# Patient Record
Sex: Male | Born: 1980 | Race: White | Hispanic: No | Marital: Married | Smoking: Never smoker
Health system: Southern US, Community
[De-identification: ages and names within clinical notes are randomized; demographics above are authoritative.]

## PROBLEM LIST (undated history)

## (undated) HISTORY — PX: APPENDECTOMY: SHX54

---

## 2005-10-30 HISTORY — PX: EYE SURGERY: SHX253

## 2011-04-27 ENCOUNTER — Other Ambulatory Visit: Payer: Self-pay | Admitting: Occupational Medicine

## 2011-04-27 ENCOUNTER — Ambulatory Visit: Payer: Self-pay

## 2011-04-27 DIAGNOSIS — M25562 Pain in left knee: Secondary | ICD-10-CM

## 2011-07-11 ENCOUNTER — Ambulatory Visit (INDEPENDENT_AMBULATORY_CARE_PROVIDER_SITE_OTHER): Payer: 59 | Admitting: Physician Assistant

## 2011-07-11 VITALS — BP 128/80 | HR 75 | Temp 98.1°F | Resp 18 | Ht 78.5 in | Wt 294.0 lb

## 2011-07-11 DIAGNOSIS — J069 Acute upper respiratory infection, unspecified: Secondary | ICD-10-CM

## 2011-07-11 MED ORDER — IPRATROPIUM BROMIDE 0.03 % NA SOLN: 2.0000 | Freq: Two times a day (BID) | NASAL | Status: AC

## 2011-07-11 MED ORDER — GUAIFENESIN ER 1200 MG PO TB12: 1.0000 | ORAL_TABLET | Freq: Two times a day (BID) | ORAL | Status: DC | PRN

## 2011-07-11 MED ORDER — BENZONATATE 100 MG PO CAPS: ORAL_CAPSULE | ORAL | Status: AC

## 2011-07-11 NOTE — Patient Instructions (Signed)
Drink 64 ounces of water each day, and get lots of rest.

## 2011-07-11 NOTE — Progress Notes (Signed)
  Subjective:    Patient ID: Marvin Pope, male    DOB: 28-Aug-1980, 31 y.o.   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  For the last several days he has had nasal congestion, sinus pressure, postnasal drainage and laryngitis. Some sore throat. Nonproductive cough. No fever or chills. No GI or GU symptoms. No myalgias or arthralgias not explained by his activity.  Review of Systems As above.    Objective:   Physical Exam Vital signs noted. Well-developed, well nourished WM who is awake, alert and oriented, in NAD. HEENT: [place]/AT, PERRL, EOMI.  Sclera and conjunctiva are clear.  EAC are patent, TMs are normal in appearance. Nasal mucosa is pink and moist. OP is clear. Neck: supple, non-tender, no lymphadenopathey, thyromegaly. Heart: RRR, no murmur Lungs: CTA Abdomen: normo-active bowel sounds, supple, non-tender, no mass or organomegaly. Extremities: no cyanosis, clubbing or edema. Skin: warm and dry without rash.        Assessment & Plan:  Viral URI.  Atrovent 0.03% nasal spray, Tessalon Perles, Mucinex maximum strength. Rest, fluids. Anticipatory guidance. Reevaluate if symptoms worsen or persist.

## 2014-05-18 ENCOUNTER — Ambulatory Visit (INDEPENDENT_AMBULATORY_CARE_PROVIDER_SITE_OTHER): Payer: 59 | Admitting: Family Medicine

## 2014-05-18 ENCOUNTER — Ambulatory Visit (INDEPENDENT_AMBULATORY_CARE_PROVIDER_SITE_OTHER): Payer: 59

## 2014-05-18 VITALS — BP 126/80 | HR 60 | Temp 98.1°F | Resp 18 | Ht 78.5 in | Wt 313.2 lb

## 2014-05-18 DIAGNOSIS — M25531 Pain in right wrist: Secondary | ICD-10-CM
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
## 2016-11-07 DIAGNOSIS — M9902 Segmental and somatic dysfunction of thoracic region: Secondary | ICD-10-CM | POA: Diagnosis not present

## 2016-11-07 DIAGNOSIS — M9905 Segmental and somatic dysfunction of pelvic region: Secondary | ICD-10-CM | POA: Diagnosis not present

## 2016-11-07 DIAGNOSIS — M9903 Segmental and somatic dysfunction of lumbar region: Secondary | ICD-10-CM | POA: Diagnosis not present

## 2016-11-10 DIAGNOSIS — M9905 Segmental and somatic dysfunction of pelvic region: Secondary | ICD-10-CM | POA: Diagnosis not present

## 2016-11-10 DIAGNOSIS — M9903 Segmental and somatic dysfunction of lumbar region: Secondary | ICD-10-CM | POA: Diagnosis not present

## 2016-11-10 DIAGNOSIS — M9902 Segmental and somatic dysfunction of thoracic region: Secondary | ICD-10-CM | POA: Diagnosis not present

## 2016-11-14 ENCOUNTER — Ambulatory Visit (INDEPENDENT_AMBULATORY_CARE_PROVIDER_SITE_OTHER): Payer: 59 | Admitting: Emergency Medicine

## 2016-11-14 ENCOUNTER — Ambulatory Visit (INDEPENDENT_AMBULATORY_CARE_PROVIDER_SITE_OTHER): Payer: 59

## 2016-11-14 ENCOUNTER — Encounter: Payer: Self-pay | Admitting: Emergency Medicine

## 2016-11-14 VITALS — BP 130/80 | HR 77 | Temp 98.2°F | Ht 78.0 in | Wt 334.4 lb

## 2016-11-14 DIAGNOSIS — M545 Low back pain, unspecified: Secondary | ICD-10-CM

## 2016-11-14 DIAGNOSIS — M47816 Spondylosis without myelopathy or radiculopathy, lumbar region: Secondary | ICD-10-CM | POA: Diagnosis not present

## 2016-11-14 DIAGNOSIS — Z0000 Encounter for general adult medical examination without abnormal findings: Secondary | ICD-10-CM

## 2016-11-14 NOTE — Patient Instructions (Addendum)
   IF you received an x-ray today, you will receive an invoice from [facility]. Please contact [facility] at [phone] with questions or concerns regarding your invoice.   IF you received labwork today, you will receive an invoice from [facility]. Please contact [facility] at [phone] with questions or concerns regarding your invoice.   Our billing staff will not be able to assist you with questions regarding bills from these companies.  You will be contacted with the lab results as soon as they are available. The fastest way to get your results is to activate your My Chart account. Instructions are located on the last page of this paperwork. If you have not heard from us regarding the results in 2 weeks, please contact this office.      Health Maintenance, Male A healthy lifestyle and preventive care is important for your health and wellness. Ask your health care provider about what schedule of regular examinations is right for you. What should I know about weight and diet? Eat a Healthy Diet  Eat plenty of vegetables, fruits, whole grains, low-fat dairy products, and lean protein.  Do not eat a lot of foods high in solid fats, added sugars, or salt.  Maintain a Healthy Weight Regular exercise can help you achieve or maintain a healthy weight. You should:  Do at least 150 minutes of exercise each week. The exercise should increase your heart rate and make you sweat (moderate-intensity exercise).  Do strength-training exercises at least twice a week.  Watch Your Levels of Cholesterol and Blood Lipids  Have your blood tested for lipids and cholesterol every 5 years starting at 35 years of age. If you are at high risk for heart disease, you should start having your blood tested when you are 35 years old. You may need to have your cholesterol levels checked more often if: ? Your lipid or cholesterol levels are high. ? You are older than 35 years of age. ? You  are at high risk for heart disease.  What should I know about cancer screening? Many types of cancers can be detected early and may often be prevented. Lung Cancer  You should be screened every year for lung cancer if: ? You are a current smoker who has smoked for at least 30 years. ? You are a former smoker who has quit within the past 15 years.  Talk to your health care provider about your screening options, when you should start screening, and how often you should be screened.  Colorectal Cancer  Routine colorectal cancer screening usually begins at 35 years of age and should be repeated every 5-10 years until you are 35 years old. You may need to be screened more often if early forms of precancerous polyps or small growths are found. Your health care provider may recommend screening at an earlier age if you have risk factors for colon cancer.  Your health care provider may recommend using home test kits to check for hidden blood in the stool.  A small camera at the end of a tube can be used to examine your colon (sigmoidoscopy or colonoscopy). This checks for the earliest forms of colorectal cancer.  Prostate and Testicular Cancer  Depending on your age and overall health, your health care provider may do certain tests to screen for prostate and testicular cancer.  Talk to your health care provider about any symptoms or concerns you have about testicular or prostate cancer.  Skin Cancer  Check your skin   from head to toe regularly.  Tell your health care provider about any new moles or changes in moles, especially if: ? There is a change in a mole's size, shape, or color. ? You have a mole that is larger than a pencil eraser.  Always use sunscreen. Apply sunscreen liberally and repeat throughout the day.  Protect yourself by wearing long sleeves, pants, a wide-brimmed hat, and sunglasses when outside.  What should I know about heart disease, diabetes, and high blood  pressure?  If you are 18-39 years of age, have your blood pressure checked every 3-5 years. If you are 40 years of age or older, have your blood pressure checked every year. You should have your blood pressure measured twice-once when you are at a hospital or clinic, and once when you are not at a hospital or clinic. Record the average of the two measurements. To check your blood pressure when you are not at a hospital or clinic, you can use: ? An automated blood pressure machine at a pharmacy. ? A home blood pressure monitor.  Talk to your health care provider about your target blood pressure.  If you are between 45-79 years old, ask your health care provider if you should take aspirin to prevent heart disease.  Have regular diabetes screenings by checking your fasting blood sugar level. ? If you are at a normal weight and have a low risk for diabetes, have this test once every three years after the age of 45. ? If you are overweight and have a high risk for diabetes, consider being tested at a younger age or more often.  A one-time screening for abdominal aortic aneurysm (AAA) by ultrasound is recommended for men aged 65-75 years who are current or former smokers. What should I know about preventing infection? Hepatitis B If you have a higher risk for hepatitis B, you should be screened for this virus. Talk with your health care provider to find out if you are at risk for hepatitis B infection. Hepatitis C Blood testing is recommended for:  Everyone born from 1945 through 1965.  Anyone with known risk factors for hepatitis C.  Sexually Transmitted Diseases (STDs)  You should be screened each year for STDs including gonorrhea and chlamydia if: ? You are sexually active and are younger than 35 years of age. ? You are older than 35 years of age and your health care provider tells you that you are at risk for this type of infection. ? Your sexual activity has changed since you were last  screened and you are at an increased risk for chlamydia or gonorrhea. Ask your health care provider if you are at risk.  Talk with your health care provider about whether you are at high risk of being infected with HIV. Your health care provider may recommend a prescription medicine to help prevent HIV infection.  What else can I do?  Schedule regular health, dental, and eye exams.  Stay current with your vaccines (immunizations).  Do not use any tobacco products, such as cigarettes, chewing tobacco, and e-cigarettes. If you need help quitting, ask your health care provider.  Limit alcohol intake to no more than 2 drinks per day. One drink equals 12 ounces of beer, 5 ounces of wine, or 1 ounces of hard liquor.  Do not use street drugs.  Do not share needles.  Ask your health care provider for help if you need support or information about quitting drugs.  Tell your health care   provider if you often feel depressed.  Tell your health care provider if you have ever been abused or do not feel safe at home. This information is not intended to replace advice given to you by your health care provider. Make sure you discuss any questions you have with your health care provider. Document Released: 10/15/2007 Document Revised: 12/16/2015 Document Reviewed: 01/20/2015 Elsevier Interactive Patient Education  2018 [employer]  American Heart Association (AHA) Exercise Recommendation  Being physically active is important to prevent heart disease and stroke, the nation's No. 1and No. 5killers. To improve overall cardiovascular health, we suggest at least 150 minutes per week of moderate exercise or 75 minutes per week of vigorous exercise (or a combination of moderate and vigorous activity). Thirty minutes a day, five times a week is an easy goal to remember. You will also experience benefits even if you divide your time into two or three segments of 10 to 15 minutes per day.  For people who would  benefit from lowering their blood pressure or cholesterol, we recommend 40 minutes of aerobic exercise of moderate to vigorous intensity three to four times a week to lower the risk for heart attack and stroke.  Physical activity is anything that makes you move your body and burn calories.  This includes things like climbing stairs or playing sports. Aerobic exercises benefit your heart, and include walking, jogging, swimming or biking. Strength and stretching exercises are best for overall stamina and flexibility.  The simplest, positive change you can make to effectively improve your heart health is to start walking. It's enjoyable, free, easy, social and great exercise. A walking program is flexible and boasts high success rates because people can stick with it. It's easy for walking to become a regular and satisfying part of life.   For Overall Cardiovascular Health:  At least 30 minutes of moderate-intensity aerobic activity at least 5 days per week for a total of 150  OR   At least 25 minutes of vigorous aerobic activity at least 3 days per week for a total of 75 minutes; or a combination of moderate- and vigorous-intensity aerobic activity  AND   Moderate- to high-intensity muscle-strengthening activity at least 2 days per week for additional health benefits.  For Lowering Blood Pressure and Cholesterol  An average 40 minutes of moderate- to vigorous-intensity aerobic activity 3 or 4 times per week  What if I can't make it to the time goal? Something is always better than nothing! And everyone has to start somewhere. Even if you've been sedentary for years, today is the day you can begin to make healthy changes in your life. If you don't think you'll make it for 30 or 40 minutes, set a reachable goal for today. You can work up toward your overall goal by increasing your time as you get stronger. Don't let all-or-nothing thinking rob you of doing what you can every day.   Source:http://www.heart.org    

## 2016-11-14 NOTE — Progress Notes (Signed)
Marvin InchesJames Pope 36 y.o.   Chief Complaint  Patient presents with  . Annual Exam    HISTORY OF PRESENT ILLNESS: This is a 36 y.o. male here for annual exam. HPI   Prior to Admission medications   Not on File    Allergies  Allergen Reactions  . Amoxicillin Diarrhea    There are no active problems to display for this patient.   No past medical history on file.  Past Surgical History:  Procedure Laterality Date  . APPENDECTOMY    . EYE SURGERY Bilateral 10/2005    Social History   Social History  . Marital status: Married    Spouse name: N/A  . Number of children: N/A  . Years of education: N/A   Occupational History  . Not on file.   Social History Main Topics  . Smoking status: Never Smoker  . Smokeless tobacco: Never Used  . Alcohol use 1.2 oz/week    2 Cans of beer per week  . Drug use: Unknown  . Sexual activity: Yes    Partners: Female   Other Topics Concern  . Not on file   Social History Narrative  . No narrative on file    Family History  Problem Relation Age of Onset  . Hypertension Father   . Heart disease Maternal Grandfather   . Cancer Paternal Grandfather        lung  . Diabetes Paternal Grandfather   . Heart disease Paternal Grandfather   . Stroke Paternal Grandfather   . Hypertension Paternal Grandfather   . Hyperlipidemia Paternal Grandfather      Review of Systems  Constitutional: Negative.  Negative for chills and fever.  HENT: Negative.   Eyes: Negative.  Negative for blurred vision and double vision.  Respiratory: Negative.  Negative for cough, shortness of breath and wheezing.   Cardiovascular: Negative.  Negative for chest pain and palpitations.  Gastrointestinal: Negative.  Negative for abdominal pain, diarrhea, nausea and vomiting.  Genitourinary: Negative.  Negative for hematuria.  Musculoskeletal: Positive for back pain.  Skin: Negative.  Negative for rash.  Neurological: Negative.  Negative for dizziness and  headaches.  Endo/Heme/Allergies: Negative.   All other systems reviewed and are negative.  Vitals:   11/14/16 1129  BP: 130/80  Pulse: 77  Temp: 98.2 F (36.8 C)     Physical Exam  Constitutional: He is oriented to person, place, and time. He appears well-developed and well-nourished.  HENT:  Head: Normocephalic and atraumatic.  Nose: Nose normal.  Mouth/Throat: Oropharynx is clear and moist. No oropharyngeal exudate.  Eyes: Pupils are equal, round, and reactive to light. Conjunctivae and EOM are normal.  Neck: Normal range of motion. Neck supple. No JVD present. No thyromegaly present.  Cardiovascular: Normal rate, regular rhythm, normal heart sounds and intact distal pulses.   Pulmonary/Chest: Effort normal and breath sounds normal.  Abdominal: Soft. Bowel sounds are normal. He exhibits no distension. There is no tenderness.  Musculoskeletal: Normal range of motion.  Lymphadenopathy:    He has no cervical adenopathy.  Neurological: He is alert and oriented to person, place, and time. No sensory deficit. He exhibits normal muscle tone.  Skin: Skin is warm and dry. Capillary refill takes less than 2 seconds.  Psychiatric: He has a normal mood and affect. His behavior is normal.   Dg Lumbar Spine 2-3 Views  Result Date: 11/14/2016 CLINICAL DATA:  Lumbago EXAM: LUMBAR SPINE - 2-3 VIEW COMPARISON:  None. FINDINGS: Frontal, lateral, and spot lumbosacral  lateral images were obtained. There are 5 non-rib-bearing lumbar type vertebral bodies. There is no fracture or spondylolisthesis. There is moderate disc space narrowing at L4-5. Other disc spaces appear unremarkable. No erosive change. 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 No fracture or spondylolisthesis. Electronically Signed   By: Bretta Bang III M.D.   On: 11/14/2016 12:06     ASSESSMENT & PLAN: [name] was seen today for annual exam.  Diagnoses and all orders for this visit:  Routine general medical  examination at a health care facility -     CBC with Differential -     Comprehensive metabolic panel -     Hemoglobin A1c -     Lipid panel -     TSH -     HIV antibody  Lumbar pain -     DG Lumbar Spine 2-3 Views; Future    Patient Instructions       IF you received an x-ray today, you will receive an invoice from [facility]. Please contact [facility] at [phone] with questions or concerns regarding your invoice.   IF you received labwork today, you will receive an invoice from [facility]. Please contact [facility] at [phone] with questions or concerns regarding your invoice.   Our billing staff will not be able to assist you with questions regarding bills from these companies.  You will be contacted with the lab results as soon as they are available. The fastest way to get your results is to activate your My Chart account. Instructions are located on the last page of this paperwork. If you have not heard from [place] regarding the results in 2 weeks, please contact this office.        Health Maintenance, Male A healthy lifestyle and preventive care is important for your health and wellness. Ask your health care provider about what schedule of regular examinations is right for you. What should I know about weight and diet? Eat a Healthy Diet  Eat plenty of vegetables, fruits, whole grains, low-fat dairy products, and lean protein.  Do not eat a lot of foods high in solid fats, added sugars, or salt.  Maintain a Healthy Weight Regular exercise can help you achieve or maintain a healthy weight. You should:  Do at least 150 minutes of exercise each week. The exercise should increase your heart rate and make you sweat (moderate-intensity exercise).  Do strength-training exercises at least twice a week.  Watch Your Levels of Cholesterol and Blood Lipids  Have your blood tested for lipids and cholesterol every 5 years starting at 35 years of age. If  you are at high risk for heart disease, you should start having your blood tested when you are 35 years old. You may need to have your cholesterol levels checked more often if: ? Your lipid or cholesterol levels are high. ? You are older than 35 years of age. ? You are at high risk for heart disease.  What should I know about cancer screening? Many types of cancers can be detected early and may often be prevented. Lung Cancer  You should be screened every year for lung cancer if: ? You are a current smoker who has smoked for at least 30 years. ? You are a former smoker who has quit within the past 15 years.  Talk to your health care provider about your screening options, when you should start screening, and how often you should be screened.  Colorectal Cancer  Routine colorectal cancer screening usually  begins at 35 years of age and should be repeated every 5-10 years until you are 35 years old. You may need to be screened more often if early forms of precancerous polyps or small growths are found. Your health care provider may recommend screening at an earlier age if you have risk factors for colon cancer.  Your health care provider may recommend using home test kits to check for hidden blood in the stool.  A small camera at the end of a tube can be used to examine your colon (sigmoidoscopy or colonoscopy). This checks for the earliest forms of colorectal cancer.  Prostate and Testicular Cancer  Depending on your age and overall health, your health care provider may do certain tests to screen for prostate and testicular cancer.  Talk to your health care provider about any symptoms or concerns you have about testicular or prostate cancer.  Skin Cancer  Check your skin from head to toe regularly.  Tell your health care provider about any new moles or changes in moles, especially if: ? There is a change in a mole's size, shape, or color. ? You have a mole that is larger than a pencil  eraser.  Always use sunscreen. Apply sunscreen liberally and repeat throughout the day.  Protect yourself by wearing long sleeves, pants, a wide-brimmed hat, and sunglasses when outside.  What should I know about heart disease, diabetes, and high blood pressure?  If you are 90-6 years of age, have your blood pressure checked every 3-5 years. If you are 72 years of age or older, have your blood pressure checked every year. You should have your blood pressure measured twice-once when you are at a hospital or clinic, and once when you are not at a hospital or clinic. Record the average of the two measurements. To check your blood pressure when you are not at a hospital or clinic, you can use: ? An automated blood pressure machine at a pharmacy. ? A home blood pressure monitor.  Talk to your health care provider about your target blood pressure.  If you are between 47-13 years old, ask your health care provider if you should take aspirin to prevent heart disease.  Have regular diabetes screenings by checking your fasting blood sugar level. ? If you are at a normal weight and have a low risk for diabetes, have this test once every three years after the age of 18. ? If you are overweight and have a high risk for diabetes, consider being tested at a younger age or more often.  A one-time screening for abdominal aortic aneurysm (AAA) by ultrasound is recommended for men aged 65-75 years who are current or former smokers. What should I know about preventing infection? Hepatitis B If you have a higher risk for hepatitis B, you should be screened for this virus. Talk with your health care provider to find out if you are at risk for hepatitis B infection. Hepatitis C Blood testing is recommended for:  Everyone born from [birth year] through 1964.  Anyone with known risk factors for hepatitis C.  Sexually Transmitted Diseases (STDs)  You should be screened each year for STDs including gonorrhea and  chlamydia if: ? You are sexually active and are younger than 35 years of age. ? You are older than 35 years of age and your health care provider tells you that you are at risk for this type of infection. ? Your sexual activity has changed since you were last screened and you  are at an increased risk for chlamydia or gonorrhea. Ask your health care provider if you are at risk.  Talk with your health care provider about whether you are at high risk of being infected with HIV. Your health care provider may recommend a prescription medicine to help prevent HIV infection.  What else can I do?  Schedule regular health, dental, and eye exams.  Stay current with your vaccines (immunizations).  Do not use any tobacco products, such as cigarettes, chewing tobacco, and e-cigarettes. If you need help quitting, ask your health care provider.  Limit alcohol intake to no more than 2 drinks per day. One drink equals 12 ounces of beer, 5 ounces of wine, or 1 ounces of hard liquor.  Do not use street drugs.  Do not share needles.  Ask your health care provider for help if you need support or information about quitting drugs.  Tell your health care provider if you often feel depressed.  Tell your health care provider if you have ever been abused or do not feel safe at home. This information is not intended to replace advice given to you by your health care provider. Make sure you discuss any questions you have with your health care provider. Document Released: 10/15/2007 Document Revised: 12/16/2015 Document Reviewed: 01/20/2015 Elsevier Interactive Patient Education  2018 [employer].  American Heart Association (AHA) Exercise Recommendation  Being physically active is important to prevent heart disease and stroke, the nation's No. 1and No. 5killers. To improve overall cardiovascular health, we suggest at least 150 minutes per week of moderate exercise or 75 minutes per week of vigorous exercise (or  a combination of moderate and vigorous activity). Thirty minutes a day, five times a week is an easy goal to remember. You will also experience benefits even if you divide your time into two or three segments of 10 to 15 minutes per day.  For people who would benefit from lowering their blood pressure or cholesterol, we recommend 40 minutes of aerobic exercise of moderate to vigorous intensity three to four times a week to lower the risk for heart attack and stroke.  Physical activity is anything that makes you move your body and burn calories.  This includes things like climbing stairs or playing sports. Aerobic exercises benefit your heart, and include walking, jogging, swimming or biking. Strength and stretching exercises are best for overall stamina and flexibility.  The simplest, positive change you can make to effectively improve your heart health is to start walking. It's enjoyable, free, easy, social and great exercise. A walking program is flexible and boasts high success rates because people can stick with it. It's easy for walking to become a regular and satisfying part of life.   For Overall Cardiovascular Health:  At least 30 minutes of moderate-intensity aerobic activity at least 5 days per week for a total of 150  OR   At least 25 minutes of vigorous aerobic activity at least 3 days per week for a total of 75 minutes; or a combination of moderate- and vigorous-intensity aerobic activity  AND   Moderate- to high-intensity muscle-strengthening activity at least 2 days per week for additional health benefits.  For Lowering Blood Pressure and Cholesterol  An average 40 minutes of moderate- to vigorous-intensity aerobic activity 3 or 4 times per week  What if I can't make it to the time goal? Something is always better than nothing! And everyone has to start somewhere. Even if you've been sedentary for years, today  is the day you can begin to make healthy changes in your life.  If you don't think you'll make it for 30 or 40 minutes, set a reachable goal for today. You can work up toward your overall goal by increasing your time as you get stronger. Don't let all-or-nothing thinking rob you of doing what you can every day.  Source:[email or web address] MD [email or web address]

## 2016-11-15 DIAGNOSIS — M9903 Segmental and somatic dysfunction of lumbar region: Secondary | ICD-10-CM | POA: Diagnosis not present

## 2016-11-15 DIAGNOSIS — M9905 Segmental and somatic dysfunction of pelvic region: Secondary | ICD-10-CM | POA: Diagnosis not present

## 2016-11-15 DIAGNOSIS — M9902 Segmental and somatic dysfunction of thoracic region: Secondary | ICD-10-CM | POA: Diagnosis not present

## 2016-11-15 LAB — CBC WITH DIFFERENTIAL/PLATELET
BASOS ABS: 0 10*3/uL (ref 0.0–0.2)
Basos: 0 %
EOS (ABSOLUTE): 0.3 10*3/uL (ref 0.0–0.4)
Eos: 3 %
Hematocrit: 44.2 % (ref 37.5–51.0)
Hemoglobin: 15.7 g/dL (ref 13.0–17.7)
Immature Grans (Abs): 0.1 10*3/uL (ref 0.0–0.1)
Immature Granulocytes: 1 %
LYMPHS ABS: 2.9 10*3/uL (ref 0.7–3.1)
Lymphs: 31 %
MCH: 32.4 pg (ref 26.6–33.0)
MCHC: 35.5 g/dL (ref 31.5–35.7)
MCV: 91 fL (ref 79–97)
MONOS ABS: 0.5 10*3/uL (ref 0.1–0.9)
Monocytes: 6 %
NEUTROS ABS: 5.4 10*3/uL (ref 1.4–7.0)
Neutrophils: 59 %
PLATELETS: 299 10*3/uL (ref 150–379)
RBC: 4.85 x10E6/uL (ref 4.14–5.80)
RDW: 13.7 % (ref 12.3–15.4)
WBC: 9.2 10*3/uL (ref 3.4–10.8)

## 2016-11-15 LAB — COMPREHENSIVE METABOLIC PANEL
ALBUMIN: 4.6 g/dL (ref 3.5–5.5)
ALK PHOS: 72 IU/L (ref 39–117)
ALT: 50 IU/L — ABNORMAL HIGH (ref 0–44)
AST: 26 IU/L (ref 0–40)
Albumin/Globulin Ratio: 1.7 (ref 1.2–2.2)
BUN / CREAT RATIO: 15 (ref 9–20)
BUN: 16 mg/dL (ref 6–20)
Bilirubin Total: 0.5 mg/dL (ref 0.0–1.2)
CO2: 19 mmol/L — ABNORMAL LOW (ref 20–29)
Calcium: 10.3 mg/dL — ABNORMAL HIGH (ref 8.7–10.2)
Chloride: 103 mmol/L (ref 96–106)
Creatinine, Ser: 1.06 mg/dL (ref 0.76–1.27)
GFR calc Af Amer: 105 mL/min/{1.73_m2} (ref >59)
GFR calc non Af Amer: 91 mL/min/{1.73_m2} (ref >59)
GLUCOSE: 88 mg/dL (ref 65–99)
Globulin, Total: 2.7 g/dL (ref 1.5–4.5)
Potassium: 4.9 mmol/L (ref 3.5–5.2)
Sodium: 143 mmol/L (ref 134–144)
Total Protein: 7.3 g/dL (ref 6.0–8.5)

## 2016-11-15 LAB — TSH: TSH: 2.27 u[IU]/mL (ref 0.450–4.500)

## 2016-11-15 LAB — LIPID PANEL
CHOL/HDL RATIO: 4.6 ratio (ref 0.0–5.0)
CHOLESTEROL TOTAL: 176 mg/dL (ref 100–199)
HDL: 38 mg/dL — AB (ref >39)
LDL CALC: 82 mg/dL (ref 0–99)
TRIGLYCERIDES: 281 mg/dL — AB (ref 0–149)
VLDL Cholesterol Cal: 56 mg/dL — ABNORMAL HIGH (ref 5–40)

## 2016-11-15 LAB — HEMOGLOBIN A1C
Est. average glucose Bld gHb Est-mCnc: 100 mg/dL
Hgb A1c MFr Bld: 5.1 % (ref 4.8–5.6)

## 2016-11-15 LAB — HIV ANTIBODY (ROUTINE TESTING W REFLEX): HIV Screen 4th Generation wRfx: NONREACTIVE

## 2016-11-17 ENCOUNTER — Encounter: Payer: Self-pay | Admitting: Radiology

## 2016-11-18 DIAGNOSIS — M9903 Segmental and somatic dysfunction of lumbar region: Secondary | ICD-10-CM | POA: Diagnosis not present

## 2016-11-18 DIAGNOSIS — M9902 Segmental and somatic dysfunction of thoracic region: Secondary | ICD-10-CM | POA: Diagnosis not present

## 2016-11-18 DIAGNOSIS — M9905 Segmental and somatic dysfunction of pelvic region: Secondary | ICD-10-CM | POA: Diagnosis not present

## 2016-11-21 DIAGNOSIS — M9902 Segmental and somatic dysfunction of thoracic region: Secondary | ICD-10-CM | POA: Diagnosis not present

## 2016-11-21 DIAGNOSIS — M9903 Segmental and somatic dysfunction of lumbar region: Secondary | ICD-10-CM | POA: Diagnosis not present

## 2016-11-21 DIAGNOSIS — M9905 Segmental and somatic dysfunction of pelvic region: Secondary | ICD-10-CM | POA: Diagnosis not present

## 2018-07-16 DIAGNOSIS — M25571 Pain in right ankle and joints of right foot: Secondary | ICD-10-CM | POA: Diagnosis not present

## 2018-11-24 IMAGING — DX DG LUMBAR SPINE 2-3V
3 series · 3 of 3 positions shown · non-contrast
Comparison: None.

CLINICAL DATA: Lumbago

EXAM:
LUMBAR SPINE - 2-3 VIEW

[l-spine ap]
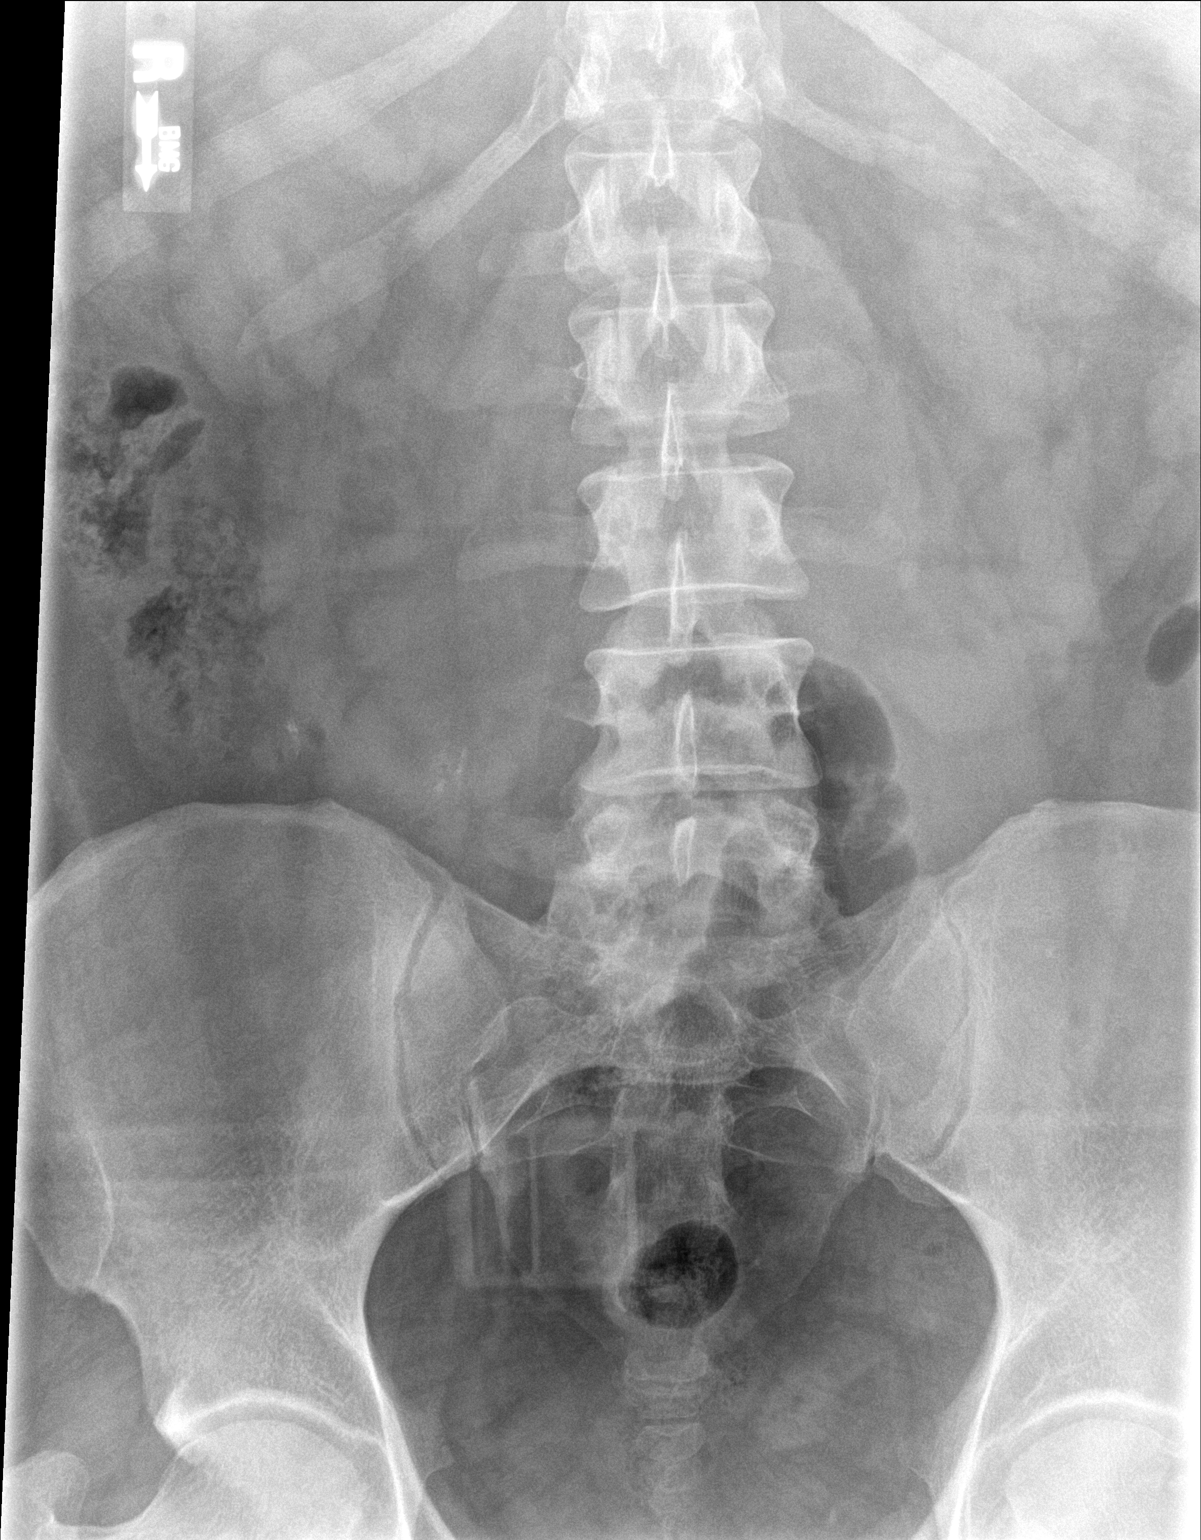

[l-spine lat]
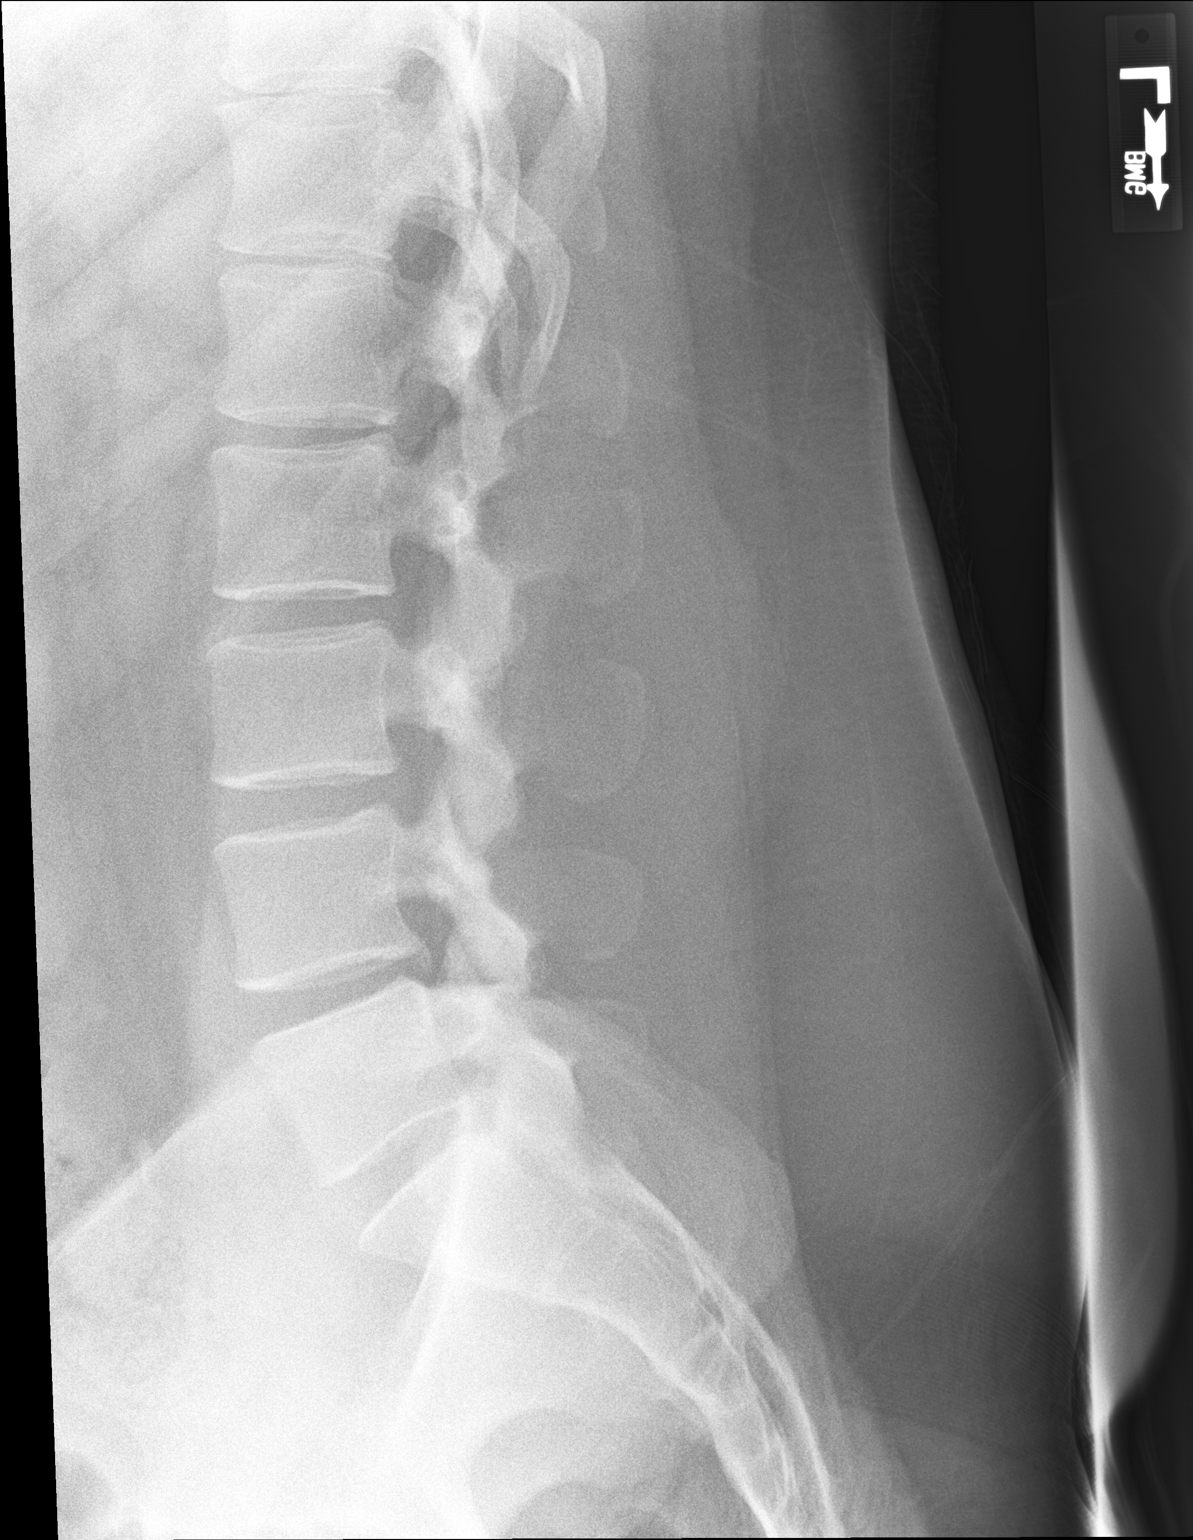

[l-spine l5-s1]
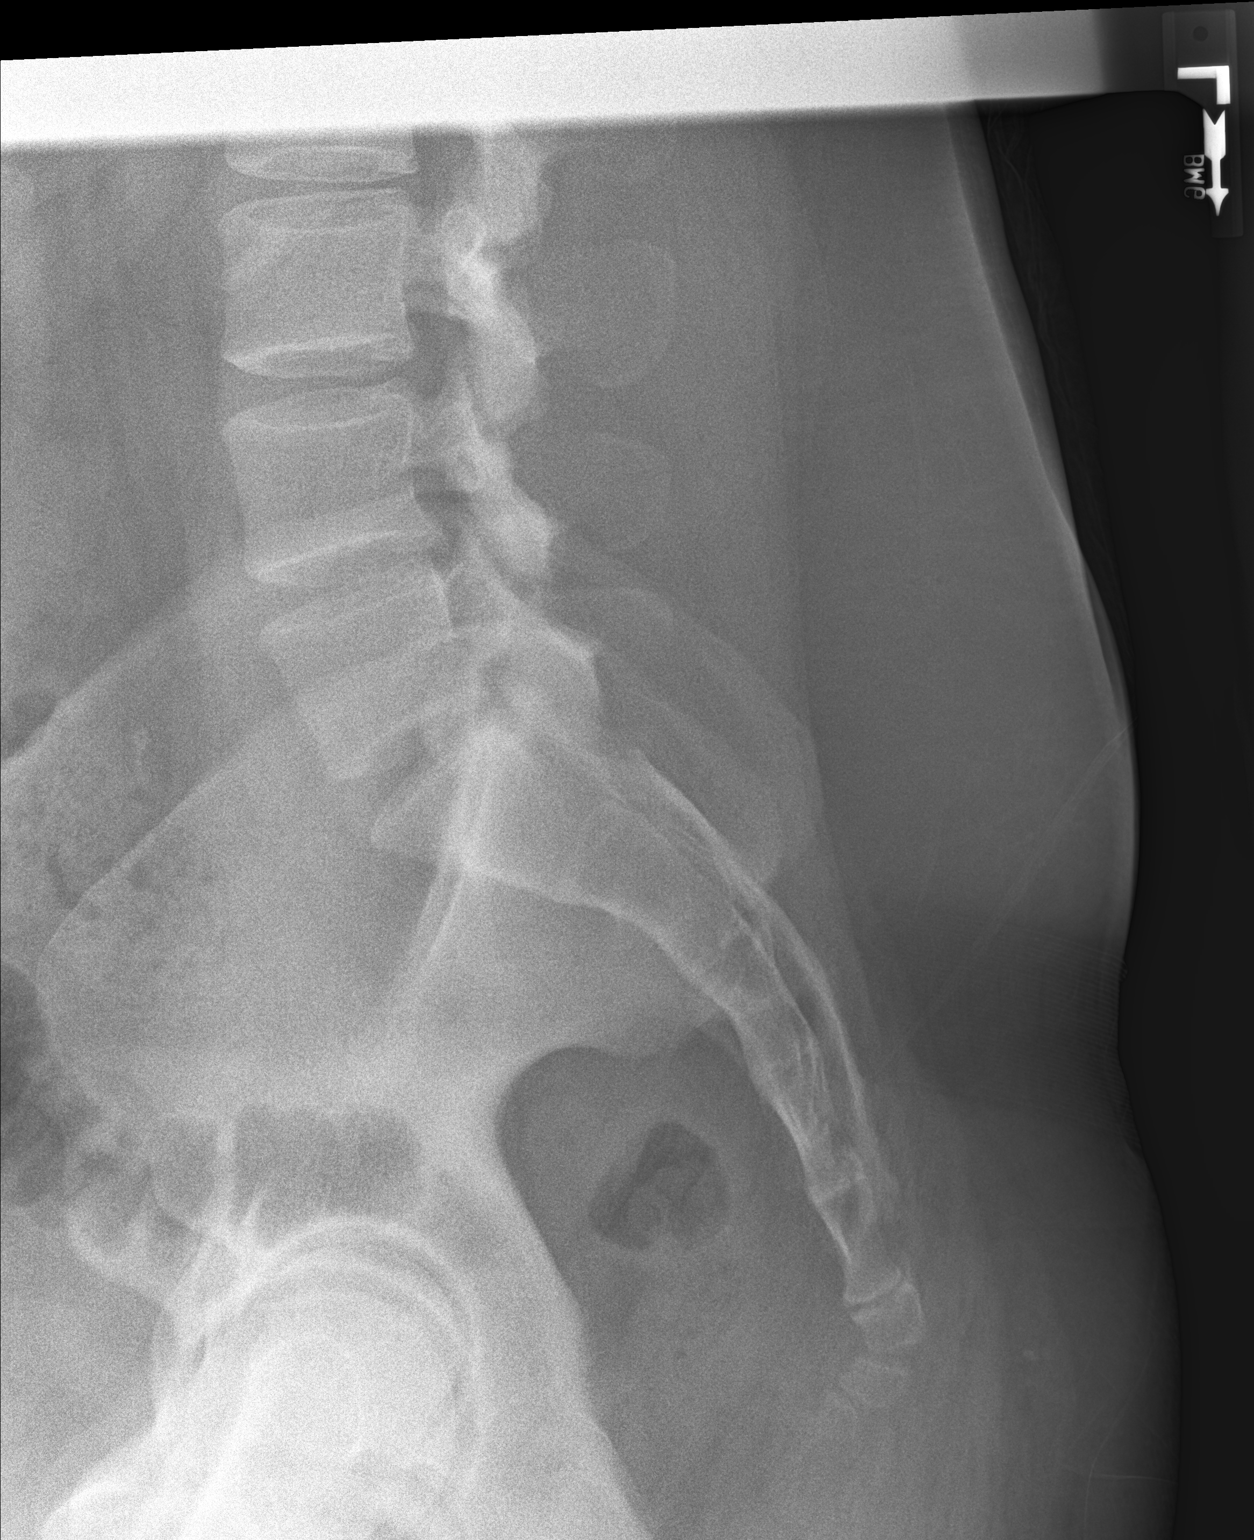

[3 of 3 positions shown; findings below may reference images not displayed]

FINDINGS: Frontal, lateral, and spot lumbosacral lateral images were obtained.
There are 5 non-rib-bearing lumbar type vertebral bodies. There is
no fracture or spondylolisthesis. There is moderate disc space
narrowing at L4-5. Other disc spaces appear unremarkable. No erosive
change.
IMPRESSION: Moderate osteoarthritic change at L4-5. No fracture or
spondylolisthesis.

## 2022-08-04 ENCOUNTER — Other Ambulatory Visit: Payer: Self-pay | Admitting: Nurse Practitioner

## 2022-08-04 ENCOUNTER — Ambulatory Visit: Payer: Self-pay

## 2022-08-04 DIAGNOSIS — M79671 Pain in right foot: Secondary | ICD-10-CM
